# Patient Record
Sex: Male | Born: 1935 | Race: White | Hispanic: No | Marital: Married | Smoking: Never smoker
Health system: Southern US, Academic
[De-identification: ages and names within clinical notes are randomized; demographics above are authoritative.]

## PROBLEM LIST (undated history)

## (undated) DIAGNOSIS — K219 Gastro-esophageal reflux disease without esophagitis: Secondary | ICD-10-CM

## (undated) DIAGNOSIS — E559 Vitamin D deficiency, unspecified: Secondary | ICD-10-CM

## (undated) DIAGNOSIS — G629 Polyneuropathy, unspecified: Secondary | ICD-10-CM

## (undated) DIAGNOSIS — E785 Hyperlipidemia, unspecified: Secondary | ICD-10-CM

## (undated) DIAGNOSIS — I7300 Raynaud's syndrome without gangrene: Secondary | ICD-10-CM

## (undated) HISTORY — PX: HX ROTATOR CUFF REPAIR: SHX139

## (undated) HISTORY — PX: PROSTATE SURGERY: SHX751

## (undated) HISTORY — DX: Vitamin D deficiency, unspecified: E55.9

## (undated) HISTORY — PX: LUMBAR SPINE SURGERY: SHX701

## (undated) HISTORY — DX: Hyperlipidemia, unspecified: E78.5

## (undated) HISTORY — PX: HX TONSILLECTOMY: SHX27

## (undated) HISTORY — DX: Raynaud's syndrome without gangrene: I73.00

## (undated) HISTORY — PX: LITHOTRIPSY: SUR834

## (undated) HISTORY — PX: COLONOSCOPY: SHX174

## (undated) HISTORY — PX: HX HERNIA REPAIR: SHX51

---

## 2007-03-19 ENCOUNTER — Other Ambulatory Visit (HOSPITAL_COMMUNITY): Payer: Self-pay

## 2015-03-12 ENCOUNTER — Ambulatory Visit (INDEPENDENT_AMBULATORY_CARE_PROVIDER_SITE_OTHER): Payer: Self-pay | Admitting: Ophthalmology

## 2015-03-12 NOTE — Telephone Encounter (Signed)
Called and left a message that he would refer to neurology here at [facility] and take the first available

## 2015-03-12 NOTE — Telephone Encounter (Signed)
-----   Message from ClarksdaleKimberly Rager sent at 03/12/2015 10:16 AM EST -----  Chyrel Massonierra from Dr. Thurston HoleHeather Skeens office would like to speak with a tech.  Cierra states Dr. Jayme CloudSkeens would like to know who Dr. Rennis HardingEllis would recommend for patient to see in neurology for peripheral neuropathy.  Chyrel MassonCierra can be reached at [phone] or [phone].  Thank you

## 2015-04-15 ENCOUNTER — Ambulatory Visit (INDEPENDENT_AMBULATORY_CARE_PROVIDER_SITE_OTHER): Payer: Self-pay | Admitting: NEUROLOGY

## 2016-08-10 ENCOUNTER — Ambulatory Visit (HOSPITAL_COMMUNITY): Payer: Self-pay | Admitting: OPHTHALMOLOGY-RETINA SPECIALIST

## 2021-09-03 ENCOUNTER — Other Ambulatory Visit: Payer: Self-pay

## 2021-09-03 ENCOUNTER — Emergency Department
Admission: EM | Admit: 2021-09-03 | Discharge: 2021-09-03 | Disposition: A | Discharge status: Home / Self Care | Payer: Medicare Other | Attending: Emergency Medicine | Admitting: Emergency Medicine

## 2021-09-03 ENCOUNTER — Encounter (HOSPITAL_COMMUNITY): Payer: Self-pay

## 2021-09-03 ENCOUNTER — Ambulatory Visit (HOSPITAL_COMMUNITY): Payer: Medicare Other

## 2021-09-03 DIAGNOSIS — Z20822 Contact with and (suspected) exposure to covid-19: Secondary | ICD-10-CM | POA: Insufficient documentation

## 2021-09-03 DIAGNOSIS — I440 Atrioventricular block, first degree: Secondary | ICD-10-CM | POA: Insufficient documentation

## 2021-09-03 DIAGNOSIS — R0789 Other chest pain: Secondary | ICD-10-CM

## 2021-09-03 HISTORY — DX: Polyneuropathy, unspecified: G62.9

## 2021-09-03 HISTORY — DX: Gastro-esophageal reflux disease without esophagitis: K21.9

## 2021-09-03 LAB — CBC WITH DIFF
BASOPHIL #: 0 10*3/uL (ref 0.00–0.30)
BASOPHIL %: 0 % (ref 0–3)
EOSINOPHIL #: 0 10*3/uL (ref 0.00–0.80)
EOSINOPHIL %: 1 % (ref 0–7)
HCT: 40.4 % — ABNORMAL LOW (ref 42.0–51.0)
HGB: 14 g/dL (ref 13.5–18.0)
LYMPHOCYTE #: 0.6 10*3/uL — ABNORMAL LOW (ref 1.10–5.00)
LYMPHOCYTE %: 9 % — ABNORMAL LOW (ref 25–45)
MCH: 30.1 pg (ref 27.0–32.0)
MCHC: 34.7 g/dL (ref 32.0–36.0)
MCV: 86.5 fL (ref 78.0–99.0)
MONOCYTE #: 0.8 10*3/uL (ref 0.00–1.30)
MONOCYTE %: 11 % (ref 0–12)
MPV: 7.8 fL (ref 7.4–10.4)
NEUTROPHIL #: 5.8 10*3/uL (ref 1.80–8.40)
NEUTROPHIL %: 80 % — ABNORMAL HIGH (ref 40–76)
PLATELETS: 144 10*3/uL (ref 140–440)
RBC: 4.67 10*6/uL (ref 4.20–6.00)
RDW: 15.1 % — ABNORMAL HIGH (ref 11.6–14.8)
WBC: 7.2 10*3/uL (ref 4.0–10.5)
WBCS UNCORRECTED: 7.2 10*3/uL

## 2021-09-03 LAB — URINALYSIS, MICROSCOPIC
HYALINE CASTS: 3 /lpf — ABNORMAL HIGH (ref <0)
RBCS: 1 /hpf (ref <4)
SQUAMOUS EPITHELIAL: <1 /hpf (ref <28)
WBCS: <1 /hpf (ref <6)

## 2021-09-03 LAB — COMPREHENSIVE METABOLIC PANEL, NON-FASTING
ALBUMIN/GLOBULIN RATIO: 1.2 (ref 0.8–1.4)
ALBUMIN: 3.7 g/dL (ref 3.5–5.7)
ALKALINE PHOSPHATASE: 94 U/L (ref 34–104)
ALT (SGPT): 37 U/L (ref 7–52)
ANION GAP: 7 mmol/L — ABNORMAL LOW (ref 10–20)
AST (SGOT): 24 U/L (ref 13–39)
BILIRUBIN TOTAL: 0.7 mg/dL (ref 0.3–1.2)
BUN/CREA RATIO: 20 (ref 6–22)
BUN: 20 mg/dL (ref 7–25)
CALCIUM, CORRECTED: 9.3 mg/dL (ref 8.9–10.8)
CALCIUM: 9 mg/dL (ref 8.6–10.3)
CHLORIDE: 100 mmol/L (ref 98–107)
CO2 TOTAL: 28 mmol/L (ref 21–31)
CREATININE: 1.02 mg/dL (ref 0.60–1.30)
ESTIMATED GFR: 72 mL/min/{1.73_m2} (ref >59)
GLOBULIN: 3.1 (ref 2.9–5.4)
GLUCOSE: 152 mg/dL — ABNORMAL HIGH (ref 74–109)
OSMOLALITY, CALCULATED: 276 mOsm/kg (ref 270–290)
POTASSIUM: 3.8 mmol/L (ref 3.5–5.1)
PROTEIN TOTAL: 6.8 g/dL (ref 6.4–8.9)
SODIUM: 135 mmol/L — ABNORMAL LOW (ref 136–145)

## 2021-09-03 LAB — URINALYSIS, MACROSCOPIC
BILIRUBIN: NEGATIVE mg/dL
BLOOD: 0.03 mg/dL
GLUCOSE: NEGATIVE mg/dL
KETONES: NEGATIVE mg/dL
LEUKOCYTES: NEGATIVE WBCs/uL
NITRITE: NEGATIVE
PH: 5.5 (ref 5.0–9.0)
PROTEIN: 20 mg/dL
SPECIFIC GRAVITY: 1.023 (ref 1.002–1.030)
UROBILINOGEN: NORMAL mg/dL

## 2021-09-03 LAB — COVID-19, FLU A/B, RSV RAPID BY PCR
INFLUENZA VIRUS TYPE A: NOT DETECTED
INFLUENZA VIRUS TYPE B: NOT DETECTED
RESPIRATORY SYNCTIAL VIRUS (RSV): NOT DETECTED
SARS-CoV-2: NOT DETECTED

## 2021-09-03 LAB — PT/INR
INR: 1.17 (ref <=5.00)
PROTHROMBIN TIME: 13.5 seconds — ABNORMAL HIGH (ref 9.8–12.7)

## 2021-09-03 LAB — MAGNESIUM: MAGNESIUM: 2 mg/dL (ref 1.9–2.7)

## 2021-09-03 LAB — TROPONIN-I
TROPONIN I: 4 ng/L (ref <20)
TROPONIN I: 4 ng/L (ref <20)
TROPONIN I: 3 ng/L (ref <20)

## 2021-09-03 LAB — PTT (PARTIAL THROMBOPLASTIN TIME): APTT: 38 seconds — ABNORMAL HIGH (ref 26.0–36.0)

## 2021-09-03 LAB — B-TYPE NATRIURETIC PEPTIDE: BNP: 52 pg/mL (ref 5–100)

## 2021-09-03 LAB — LIPASE: LIPASE: 13 U/L (ref 11–82)

## 2021-09-03 MED ORDER — ASPIRIN 81 MG CHEWABLE TABLET
CHEWABLE_TABLET | ORAL | Status: AC
Start: 2021-09-03 — End: 2021-09-03
  Filled 2021-09-03: qty 4

## 2021-09-03 MED ORDER — ASPIRIN 81 MG CHEWABLE TABLET
324.0000 mg | CHEWABLE_TABLET | ORAL | Status: AC
Start: 2021-09-03 — End: 2021-09-03
  Administered 2021-09-03: 324 mg via ORAL

## 2021-09-03 NOTE — ED APP Handoff Note (Signed)
[facility]  Emergency Department  Provider in Triage Note    Name: Dale Jordan  Age: 88 y.o.  Gender: male     Subjective:   Dale Jordan is a 88 y.o. male who presents with complaint of Chest Pain   .  Pt presents for evaluation of chest pain started today. Pt states he has been sick this past week and was seen at [facility] this past week.  Patient states was negative for COVID and flu.  Does state that he has been having body aches, shortness of breath, nausea, vomiting.  He states he has not been eating and drinking well.  Notes that he has worsening chest pain whenever he bends over and feels he can not get his breath.  Patient states he has not had any diaphoresis.  He does note that he can not eat anything due to feeling sick.    Objective:   Filed Vitals:    09/03/21 1541   BP: 124/65   Pulse: 78   Resp: 20   Temp: 37.1 C (98.8 F)   SpO2: 97%      Focused Physical Exam shows general sitting in wheelchair, no distress noted.     Assessment:  A medical screening exam was completed.  This patient is a 88 y.o. male with initial findings showing chest pain    Plan:  Please see initial orders and work-up below.  This is to be continued with full evaluation in the main Emergency Department.     aspirin chewable tablet 324 mg, 324 mg, Oral, Now       Results for orders placed or performed during the hospital encounter of 09/03/21 (from the past 24 hour(s))   CBC/DIFF    Narrative    The following orders were created for panel order CBC/DIFF.  Procedure                               Abnormality         Status                     ---------                               -----------         ------                     CBC WITH PYKD[[id number]]                                                                 Please view results for these tests on the individual orders.   URINALYSIS, MACROSCOPIC AND MICROSCOPIC W/CULTURE REFLEX    Specimen: Urine, Site not specified    Narrative    The  following orders were created for panel order URINALYSIS, MACROSCOPIC AND MICROSCOPIC W/CULTURE REFLEX.  Procedure                               Abnormality         Status                     ---------                               -----------         ------  URINALYSIS, MACROSCOPIC[[id number]]                                                     URINALYSIS, MICROSCOPIC[[id number]]                                                       Please view results for these tests on the individual orders.        Roma Kayser, APRN  09/03/2021, 15:44

## 2021-09-03 NOTE — ED Provider Notes (Signed)
[facility]  ED Primary Provider Note  History of Present Illness   chief complaint    Arrival: The patient arrived by Car         Dale Jordan is a 88 y.o. male who had concerns including Chest Pain .  Patient 88 year old male presents to the emergency complaint of sharp chest pain which occurred earlier today at approximately noon.  Patient states that he bent over and had a very sharp chest pain in the lower anterior chest.  He rated the pain as 10/10.  He also states he had shortness of breath.  It resolved when he stood up.  He states that he has been having generalized pain from his head to his feet which he has been aching in nature to include a headache x1 week.  He states it did improve earlier today.  He denies any fever.  He is having no shortness of breath.  Denies any prior cardiac history.  Blood pressure is 124/65 with a heart rate of 78, respiratory rate is 20 with an oxygen saturating 97% on room air.  He is afebrile at 98.8.  Patient did go to [place] initially they in turn sent him here to the emergency room.  He did have a negative COVID and flu at [place].  Review of patient's medical records shows that he did have a prior cardiac catheterization 2017 in [facility] for lower extremity edema/pain and possible angina.  This only showed mild occlusive disease with nothing greater than 30% at that time.  All nursing notes reviewed        Review of Systems     No other overt Review of Systems are noted to be positive except noted in the HPI.      Historical Data   History Reviewed This Encounter: Medical History  Surgical History  Family History  Social History        Physical Exam   ED Triage Vitals [09/03/21 1541]   BP (Non-Invasive) 124/65   Heart Rate 78   Respiratory Rate 20   Temperature 37.1 C (98.8 F)   SpO2 97 %   Weight 74.8 kg (165 lb)   Height 1.753 m (_0 )         Exam:   Constitutional:  Patient alert orient x3 in no apparent distress.  No  limitations.  Head: Atraumatic normocephalic  Eyes :  Pupils are equal round reactive to light and accommodation extraocular muscles are intact.  Sclera and conjunctiva are unremarkable  Ears:  Tympanic membranes are pearly gray bilaterally; external auditory canals are unremarkable; external ears without any lesions  Nose:  Nares are patent turbinates are pink and moist  Mouth:  Mucosa is pink and moist without lesions.  Posterior pharynx is pink and moist without hypertrophy/exudate.  Neck:  Soft and supple without palpable lymphadenopathy.  Heart:  Regular rate and rhythm with a 1/6 holosystolic ejection murmur   Lungs:  Clear to auscultation bilaterally without any wheezing/rales/rhonchi  Abdomen:  Soft nontender without any rebound or guarding; positive bowel sounds throughout  Genitalia:  Deferred  Skin:  Hemosiderin deposits to bilateral lower extremity.  Normal skin turgor.  Brisk capillary refill distally  Extremities:  Hemosiderin deposits to bilateral lower extremity otherwise unremarkable.  Good strenght bilaterally with full range of motion of upper and lower extremities.  Neuro:  Alert oriented x3.  Cranial nerves II-XII grossly intact as tested.  Excellent sensation distally over all dermatomes.  Psychiatric:  Patient cooperative, affect appropriate, insight and judgment good      Procedures      Patient Data     Labs Ordered/Reviewed   COMPREHENSIVE METABOLIC PANEL, NON-FASTING - Abnormal; Notable for the following components:       Result Value    SODIUM 135 (*)     ANION GAP 7 (*)     GLUCOSE 152 (*)     All other components within normal limits    Narrative:     Estimated Glomerular Filtration Rate (eGFR) is calculated using the CKD-EPI (2021) equation, intended for patients 62 years of age and older. If gender is not documented or "unknown", there will be no eGFR calculation.   CBC WITH DIFF - Abnormal; Notable for the following components:    HCT 40.4 (*)     RDW 15.1 (*)     NEUTROPHIL % 80 (*)      LYMPHOCYTE % 9 (*)     LYMPHOCYTE # 0.60 (*)     All other components within normal limits   URINALYSIS, MICROSCOPIC - Abnormal; Notable for the following components:    MUCOUS Moderate (*)     HYALINE CASTS 3 (*)     All other components within normal limits   PT/INR - Abnormal; Notable for the following components:    PROTHROMBIN TIME 13.5 (*)     All other components within normal limits    Narrative:     INR OF 2.0-3.0  RECOMMENDED FOR: PROPHYLAXIS/TREATMENT OF VENEOUS THROMBOSIS, PULMONARY EMBOLISM, PREVENTION OF SYSTEMIC EMBOLISM FROM ATRIAL FIBRILATION, MYOCARDIAL INFARCTION.    INR OF 2.5-3.5  RECOMMENDED FOR MECHANICAL PROSTHETIC HEART VALVES, RECURRENT SYSTEMIC EMBOLISM, RECURRENT MYOCARDIAL INFARCTION.     PTT (PARTIAL THROMBOPLASTIN TIME) - Abnormal; Notable for the following components:    APTT 38.0 (*)     All other components within normal limits   TROPONIN-I - Normal   TROPONIN-I - Normal   TROPONIN-I - Normal   B-TYPE NATRIURETIC PEPTIDE - Normal    Narrative:                                 Class 1: 101-250 pg/mL                              Class 2: 251-550 pg/mL                              Class 3: 551-900 pg/mL                              Class 4: >901 pg/mL     The [place] Heart Association has developed a four-stage functional classification system for CHF that is based on a subjective interpretation of the severity of a patient's clinical signs and symptoms.    Class 1 - Patients have no limitations on physical activity and have no symptoms with ordinary physical activity.    Class 2 - Patients have a slight limitation of physical activity and have symptoms with ordinary physical activity.    Class 3 - Patients have a marked limitation of physical activity and have symptoms with less than ordinary physical activity, but not at rest.    Class 4 - Patients are unable to perform any physical  activity without discomfort.   LIPASE - Normal   MAGNESIUM - Normal   URINALYSIS, MACROSCOPIC -  Normal   COVID-19, FLU A/B, RSV RAPID BY PCR - Normal    Narrative:     Results are for the simultaneous qualitative identification of SARS-CoV-2 (formerly 2019-nCoV), Influenza A, Influenza B, and RSV RNA. These etiologic agents are generally detectable in nasopharyngeal and nasal swabs during the ACUTE PHASE of infection. Hence, this test is intended to be performed on respiratory specimens collected from individuals with signs and symptoms of upper respiratory tract infection who meet Centers for Disease Control and Prevention (CDC) clinical and/or epidemiological criteria for Coronavirus Disease 2019 (COVID-19) testing. CDC COVID-19 criteria for testing on human specimens is available at [facility] webpage information for Healthcare Professionals: Coronavirus Disease 2019 (COVID-19) ([employer]).     False-negative results may occur if the virus has genomic mutations, insertions, deletions, or rearrangements or if performed very early in the course of illness. Otherwise, negative results indicate virus specific RNA targets are not detected, however negative results do not preclude SARS-CoV-2 infection/COVID-19, Influenza, or Respiratory syncytial virus infection. Results should not be used as the sole basis for patient management decisions. Negative results must be combined with clinical observations, patient history, and epidemiological information. If upper respiratory tract infection is still suspected based on exposure history together with other clinical findings, re-testing should be considered.    Disclaimer:   This assay has been authorized by FDA under an Emergency Use Authorization for use in laboratories certified under the Clinical Laboratory Improvement Amendments of 1988 (CLIA), 42 U.S.C. [phone], to perform high complexity tests. The impacts of vaccines, antiviral therapeutics, antibiotics, chemotherapeutic or immunosuppressant drugs have not been evaluated.      Test methodology:   Cepheid Xpert Xpress SARS-CoV-2/Flu/RSV Assay real-time polymerase chain reaction (RT-PCR) test on the GeneXpert Dx and Xpert Xpress systems.   CBC/DIFF    Narrative:     The following orders were created for panel order CBC/DIFF.  Procedure                               Abnormality         Status                     ---------                               -----------         ------                     CBC WITH DIFF[[id number]]                Abnormal            Final result                 Please view results for these tests on the individual orders.   URINALYSIS, MACROSCOPIC AND MICROSCOPIC W/CULTURE REFLEX    Narrative:     The following orders were created for panel order URINALYSIS, MACROSCOPIC AND MICROSCOPIC W/CULTURE REFLEX.  Procedure                               Abnormality         Status                     ---------                               -----------         ------  URINALYSIS, MACROSCOPIC[[id number]]      Normal              Final result               URINALYSIS, MICROSCOPIC[[id number]]      Abnormal            Final result                 Please view results for these tests on the individual orders.       XR AP MOBILE CHEST   Final Result by Edi, Radresults In (07/08 1621)   NO ACUTE FINDINGS.         Radiologist location ID: Collin Making          Medical Decision Making  Patient had no recurrence of chest pain while in the emergency room.  Patient's troponin was 4, 3, 3.  EKG showed no acute ischemia.  Patient did have cardiac catheterization in 2017 which showed up to 30 % blockage.  I did discuss patient's findings in detail with him.  I did recommend that he be admitted to the hospitalist service; however, patient states she would rather go home and follow up with Dr. Randolm Idol.  Patient be discharged home.  See discharge instructions for detailed    Amount and/or Complexity of Data Reviewed  Labs: ordered.  Decision-making details documented in ED Course.  Radiology: ordered and independent interpretation performed. Decision-making details documented in ED Course.  ECG/medicine tests: ordered and independent interpretation performed. Decision-making details documented in ED Course.      Risk  OTC drugs.      Critical Care  Total time providing critical care: 0 minutes      ED Course as of 09/03/21 2111   Sat Sep 03, 2021   1642 EKG shows sinus rhythm with a first-degree AV block with a PR interval of 214, normal axis, normal R-wave progression, no ectopy, no ST elevation/depression, normal QRS.  No prior EKG on file.   1837 Single portable view of the chest shows no acute infiltrate, no acute disease process.  There is some mild atelectasis in bilateral bases   2106 Serial troponins are 4, 4, 3.         Medications Administered in the ED   aspirin chewable tablet 324 mg (324 mg Oral Given 09/03/21 1550)       Following the history, physical exam, and ED workup, the patient was deemed stable and suitable for discharge. The patient/caregiver was advised to return to the ED for any new or worsening symptoms. Discharge medications, and follow-up instructions were discussed with the patient/caregiver in detail, who verbalizes understanding. The patient/caregiver is in agreement and is comfortable with the plan of care.    Disposition: Discharged         Current Discharge Medication List      CONTINUE these medications - NO CHANGES were made during your visit.      Details   bumetanide 1 mg Tablet  Commonly known as: BUMEX   1 mg, Oral  Refills: 0     ergocalciferol (vitamin D2) 1,250 mcg (50,000 unit) Capsule  Commonly known as: DRISDOL   50,000 Units, Oral  Refills: 0     omeprazole 20 mg Capsule, Delayed Release(E.C.)  Commonly known as: PRILOSEC   20 mg, Oral  Refills: 0  Follow up:   Gaynelle Adu, DO  [address]  [place] [address]  [phone]    In 1 week  Follow-up with Dr. Randolm Idol for recheck this  week                 Clinical Impression   Atypical chest pain (Primary)         Current Discharge Medication List          R.A. Baldwin [place], DO  Department of Emergency Medicine

## 2021-09-03 NOTE — Discharge Instructions (Signed)
Take all regular medications as prescribed  Follow-up with Dr. Mattie Marlin for recheck this coming week  Return to emergency room for any recurrence of chest pain, shortness of breath, or any concerns

## 2021-09-03 NOTE — ED Nurses Note (Signed)
Patient states he had some nausea and headache that started on Tuesday. Nausea has subsided and has minor headache. Had fever 102 on wedneday tested neg for covid/flu at [facility]. Has c/o weakness, pain and SOB with bending over and increased SOB with walking stairs. Denies SOB, pain, and nausea at this time. Hooked up to monitor including cardiac, BP & O2. Spouse at bedside

## 2021-09-03 NOTE — ED Triage Notes (Signed)
CP AND SOB SINCE THIS AM. STATES HAS BEEN SICK SINCE LAST Tuesday. DECREASED APPETITE, FATIGUE. FLU AND COVID NEG ON WED BY [facility].

## 2021-09-04 LAB — ECG 12 LEAD
Atrial Rate: 81 {beats}/min
Calculated P Axis: 42 degrees
Calculated R Axis: 8 degrees
Calculated T Axis: 2 degrees
PR Interval: 214 ms
QRS Duration: 90 ms
QT Interval: 356 ms
QTC Calculation: 413 ms
Ventricular rate: 81 {beats}/min

## 2021-10-13 ENCOUNTER — Other Ambulatory Visit (HOSPITAL_COMMUNITY): Payer: Self-pay | Admitting: FAMILY PRACTICE

## 2021-10-13 ENCOUNTER — Inpatient Hospital Stay
Admission: RE | Admit: 2021-10-13 | Discharge: 2021-10-13 | Disposition: A | Discharge status: Home / Self Care | Payer: Medicare Other | Source: Ambulatory Visit | Attending: FAMILY PRACTICE | Admitting: FAMILY PRACTICE

## 2021-10-13 ENCOUNTER — Other Ambulatory Visit: Payer: Self-pay

## 2021-10-13 DIAGNOSIS — M542 Cervicalgia: Secondary | ICD-10-CM

## 2021-10-17 ENCOUNTER — Other Ambulatory Visit (HOSPITAL_COMMUNITY): Payer: Self-pay | Admitting: FAMILY PRACTICE

## 2021-10-17 DIAGNOSIS — R519 Headache, unspecified: Secondary | ICD-10-CM

## 2021-10-24 ENCOUNTER — Other Ambulatory Visit: Payer: Self-pay

## 2021-10-24 ENCOUNTER — Inpatient Hospital Stay
Admission: RE | Admit: 2021-10-24 | Discharge: 2021-10-24 | Disposition: A | Discharge status: Home / Self Care | Payer: Medicare Other | Source: Ambulatory Visit | Attending: FAMILY PRACTICE | Admitting: FAMILY PRACTICE

## 2021-10-24 DIAGNOSIS — R519 Headache, unspecified: Secondary | ICD-10-CM | POA: Insufficient documentation

## 2022-03-27 ENCOUNTER — Other Ambulatory Visit (HOSPITAL_COMMUNITY): Payer: Self-pay | Admitting: FAMILY PRACTICE

## 2022-03-27 ENCOUNTER — Other Ambulatory Visit: Payer: Self-pay

## 2022-03-27 ENCOUNTER — Inpatient Hospital Stay
Admission: RE | Admit: 2022-03-27 | Discharge: 2022-03-27 | Disposition: A | Discharge status: Home / Self Care | Payer: Medicare Other | Source: Ambulatory Visit | Attending: FAMILY PRACTICE | Admitting: FAMILY PRACTICE

## 2022-03-27 DIAGNOSIS — U071 COVID-19: Secondary | ICD-10-CM

## 2022-12-12 IMAGING — MR MRI CERVICAL SPINE WITHOUT CONTRAST
4 of 5 series · 24 of 48 positions shown · IV contrast (gadolinium)
Comparison: None available.

﻿EXAM:  [id number]   MRI CERVICAL SPINE WITHOUT CONTRAST
INDICATION: Degenerative disc disease.
TECHNIQUE: Multiplanar multisequential MRI of the cervical spine was performed without gadolinium contrast.

[Series 5: T2 · sagittal · 3.0mm · 0.75mm/px · 8 of 13 slices shown (1 of 2)]
[im 1/13]
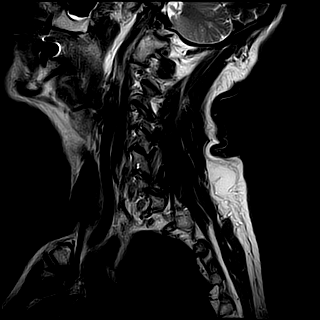
[im 2/13]
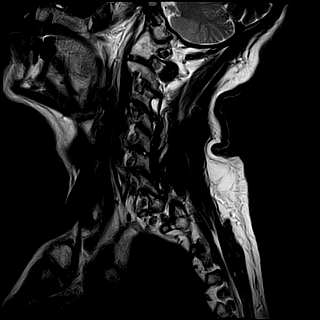
[im 4/13]
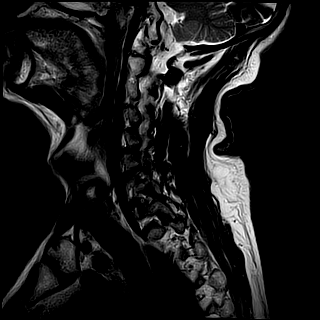
[im 6/13]
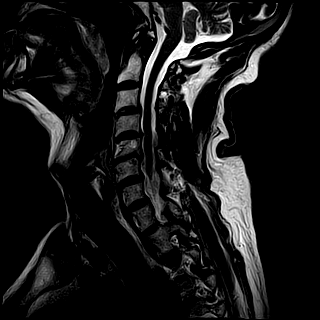
[im 7/13]
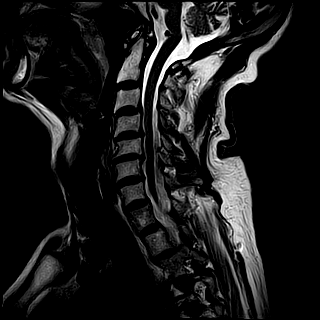
[im 9/13]
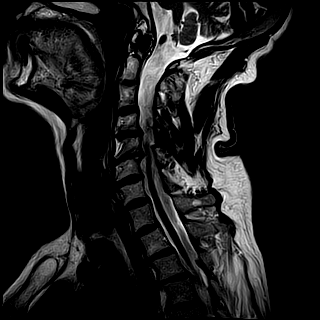
[im 11/13]
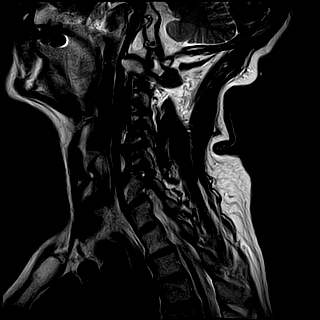
[im 13/13]
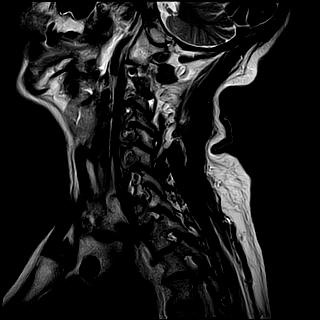

[Series 6: T1 · sagittal · 3.0mm · 0.47mm/px · 4 of 13 slices shown]
[im 1/13]
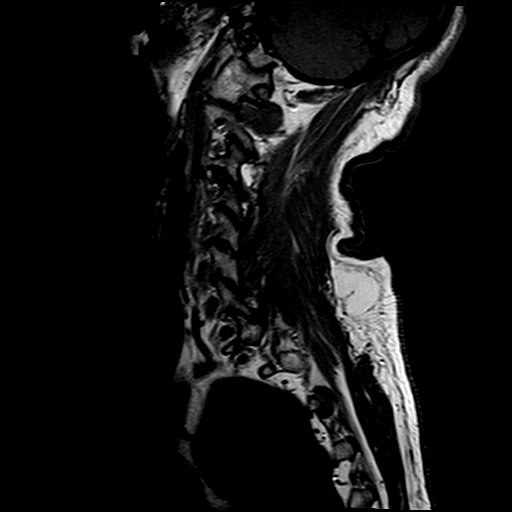
[im 2/13]
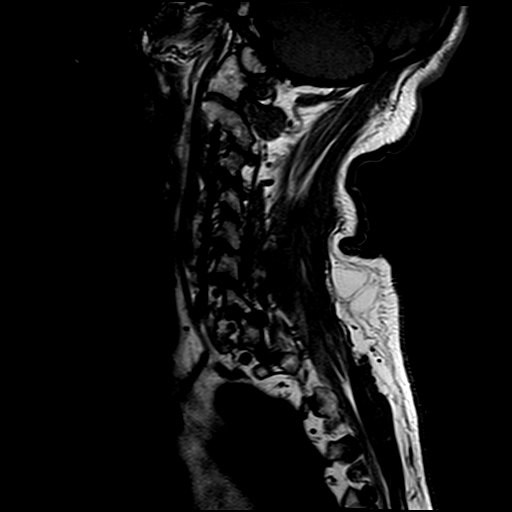
[im 7/13]
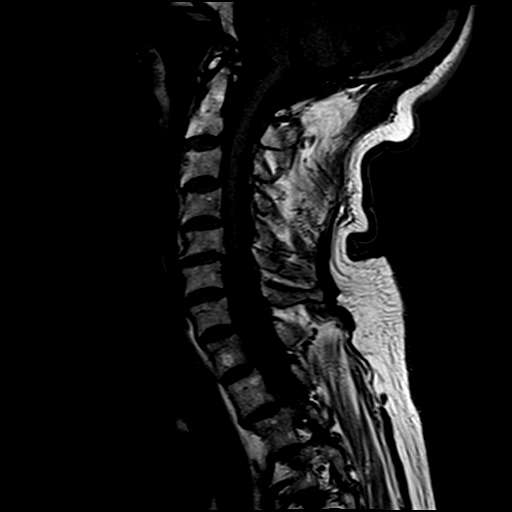
[im 11/13]
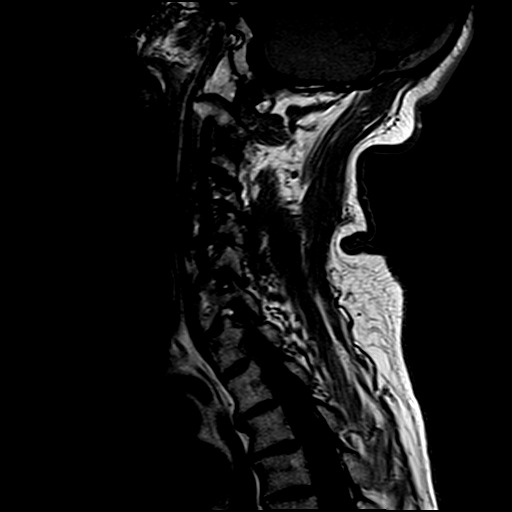

[Series 7: STIR · sagittal · 3.0mm · 0.47mm/px · 3 of 13 slices shown]
[im 2/13]
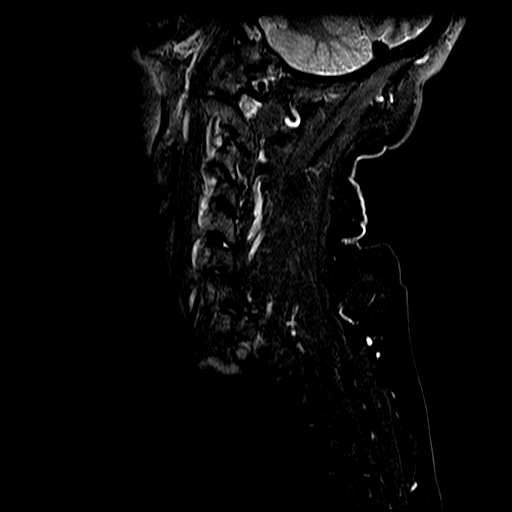
[im 7/13]
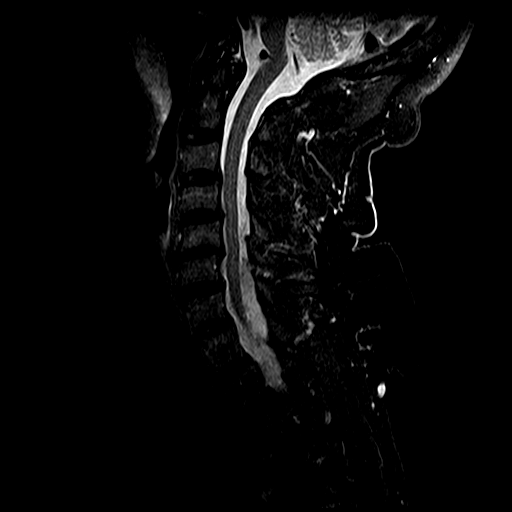
[im 11/13]
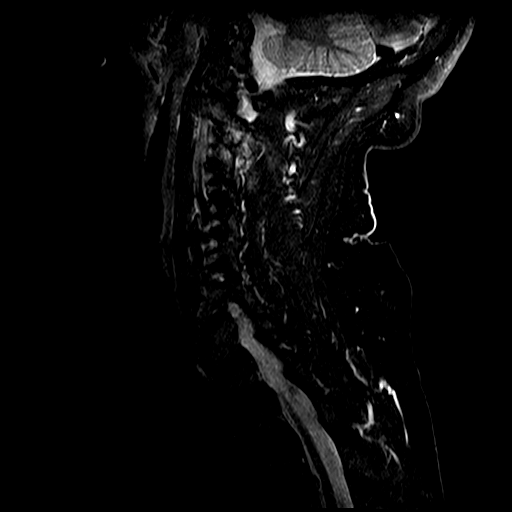

[Series 9: T2 · axial · 3.0mm · 0.39mm/px · z∈[-88,+24]mm · 9 of 18 slices shown (2 of 2)]
[im 1/18]
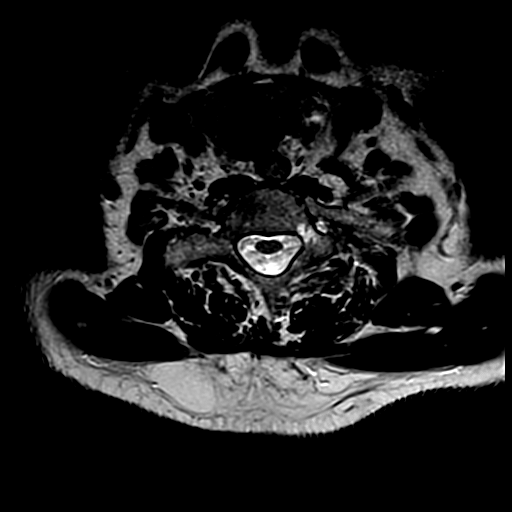
[im 4/18]
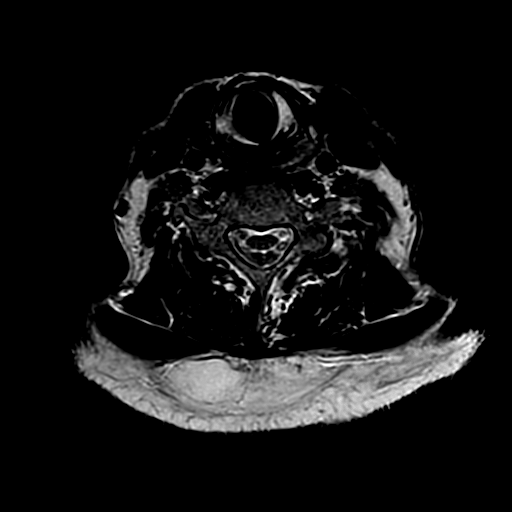
[im 5/18]
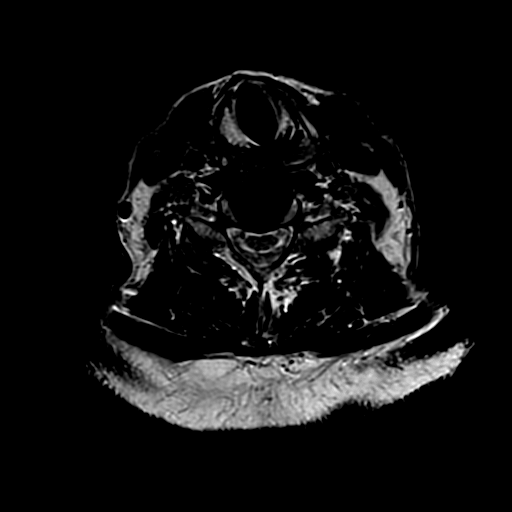
[im 8/18]
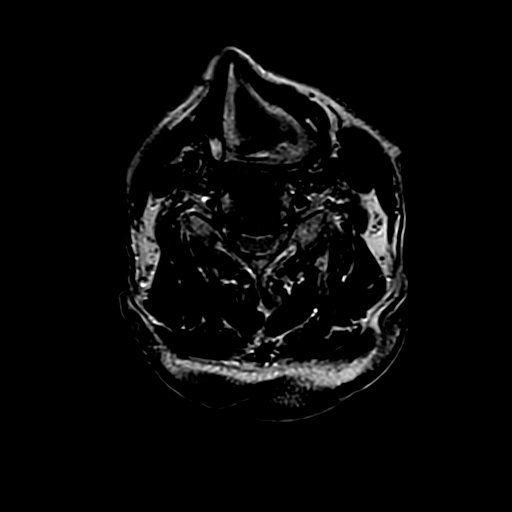
[im 10/18]
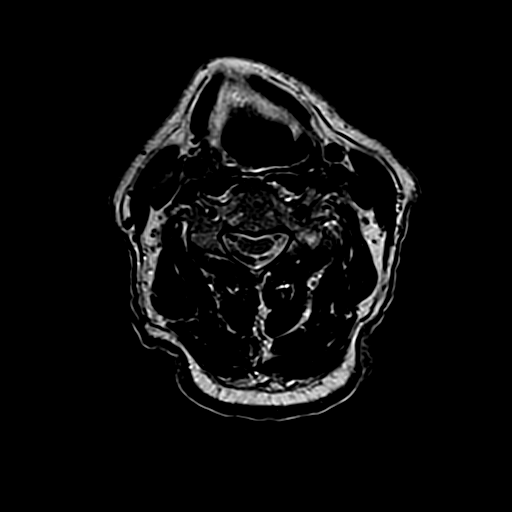
[im 13/18]
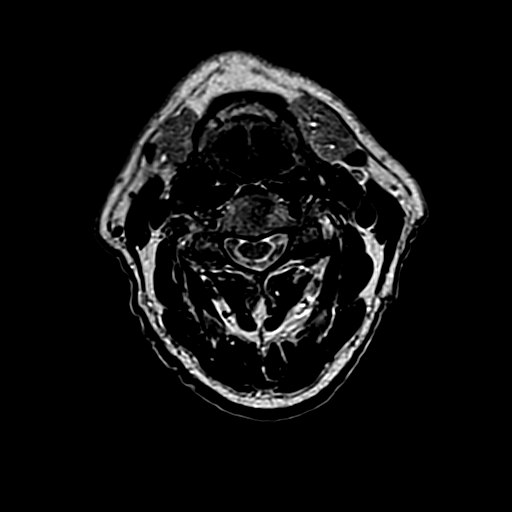
[im 14/18]
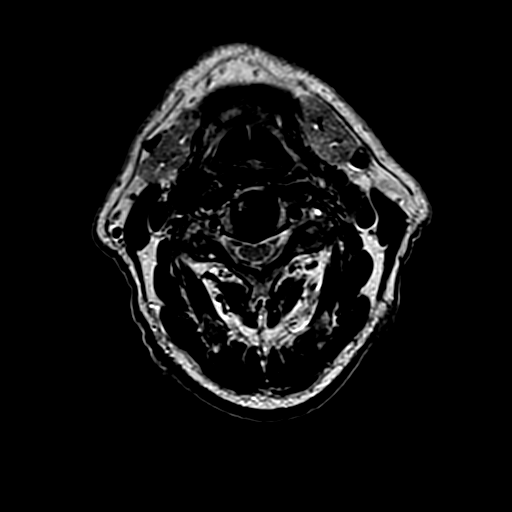
[im 16/18]
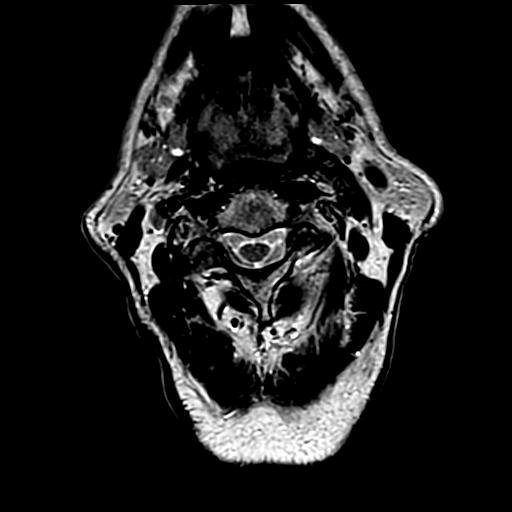
[im 18/18]
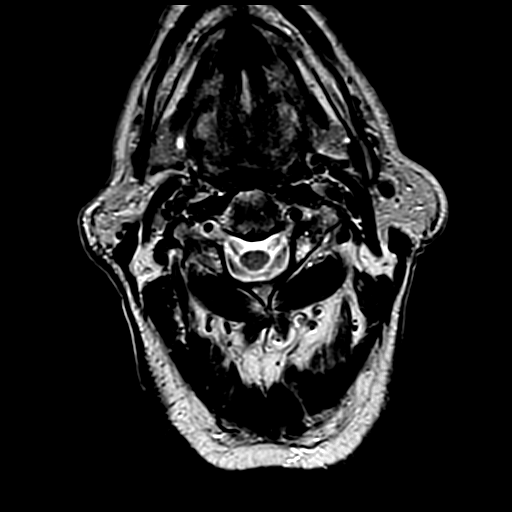

[24 of 48 positions shown; findings below may reference images not displayed]

FINDINGS: There are moderate degenerative changes predominantly at C4-5, C5-6, and C6-7 with disc space narrowing and osteophyte formation.  

There is mild disc bulging at multiple levels including C4-5, C5-6, and C6-7.  There is slight compression of the thecal sac at these levels but no apparent spinal cord compression.  

There is no fracture, malalignment, significant spinal stenosis, disc herniation, abnormality of the spinal cord, or Maria Lucilene malformation.
IMPRESSION: 1. Moderate degenerative changes.

2. Mild multilevel disc bulging. 

3. No spinal stenosis or disc herniation seen.

## 2023-04-09 ENCOUNTER — Encounter (INDEPENDENT_AMBULATORY_CARE_PROVIDER_SITE_OTHER): Payer: Self-pay | Admitting: Surgery

## 2023-04-11 ENCOUNTER — Ambulatory Visit (INDEPENDENT_AMBULATORY_CARE_PROVIDER_SITE_OTHER): Payer: Medicare Other | Admitting: Surgery

## 2023-04-11 ENCOUNTER — Other Ambulatory Visit: Payer: Self-pay

## 2023-04-11 ENCOUNTER — Encounter (INDEPENDENT_AMBULATORY_CARE_PROVIDER_SITE_OTHER): Payer: Self-pay | Admitting: Surgery

## 2023-04-11 VITALS — BP 122/71 | HR 69 | Temp 97.6°F | Wt 178.0 lb

## 2023-04-11 DIAGNOSIS — R1013 Epigastric pain: Secondary | ICD-10-CM

## 2023-04-11 DIAGNOSIS — K219 Gastro-esophageal reflux disease without esophagitis: Secondary | ICD-10-CM

## 2023-04-11 DIAGNOSIS — R110 Nausea: Secondary | ICD-10-CM

## 2023-04-13 ENCOUNTER — Encounter (INDEPENDENT_AMBULATORY_CARE_PROVIDER_SITE_OTHER): Payer: Self-pay | Admitting: Surgery

## 2023-04-13 NOTE — H&P (Signed)
Office History and Physical      Reason for Visit: Esophageal Reflux    History of Present Illness  [name] presents as a referral by Dr Dale Jordan evaluation of symptomatic gastroesophageal reflux disease.  Has a history of controlled symptoms over 25 years with the use of Prilosec.  About 6 weeks ago developed significant worsening symptoms which are only partially controlled with over-the-counter regimen.  Has been on Carafate, Protonix, and Pepcid without relief of symptoms.  Has associated nausea without vomiting.  No melena, hematochezia, hematemesis.  No history of nonsteroidal anti-inflammatory use.  Awakens him at night.  Last colonoscopy was in 2009 revealing internal and external hemorrhoids with diverticulosis.  No prior upper endoscopy.  No weight loss.  Overall very healthy.  Active.        I have reviewed the patient's provided medical records and diagnostic testing including laboratory values, imaging results, documented encounters and providers notes with all pertinent information noted with respect to today's evaluation serving as unique tests and sources as a component of the medical decision making process for this encounter relevant to the patients independent evaluation by me today.        Patient Data  Patient History  Past Medical History:   Diagnosis Date    GERD (gastroesophageal reflux disease)     HLD (hyperlipidemia)     Neuropathy (CMS HCC)     Vitamin D deficiency          Past Surgical History:   Procedure Laterality Date    COLONOSCOPY      HX HERNIA REPAIR      HX ROTATOR CUFF REPAIR      HX TONSILLECTOMY      LITHOTRIPSY      LUMBAR SPINE SURGERY      PROSTATE SURGERY           Current Outpatient Medications   Medication Sig    bumetanide (BUMEX) 1 mg Oral Tablet Take 1 Tablet (1 mg total) by mouth    cilostazoL (PLETAL) 100 mg Oral Tablet Take 1 Tablet (100 mg total) by mouth Twice daily (Patient not taking: Reported on 04/11/2023)    ergocalciferol, vitamin D2, (DRISDOL)  1,250 mcg (50,000 unit) Oral Capsule Take 1 Capsule (50,000 Units total) by mouth    omeprazole (PRILOSEC) 20 mg Oral Capsule, Delayed Release(E.C.) Take 1 Capsule (20 mg total) by mouth    Omeprazole Magnesium 20 mg Oral Tablet, Delayed Release (E.C.) Take 1 Tablet (20 mg total) by mouth    potassium chloride (K-DUR) 10 mEq Oral Tab Sust.Rel. Particle/Crystal Take 1 Tablet (10 mEq total) by mouth     No Known Allergies  Family Medical History:    None         Social History     Tobacco Use    Smoking status: Never    Smokeless tobacco: Never   Vaping Use    Vaping status: Never Used   Substance Use Topics    Alcohol use: Never    Drug use: Never        The above documented section regarding past medical, past surgical, family, and social history (PMFSH) has been reviewed and considered and to the best of my knowledge represents a valid and accurate reflection of the patient's previous pertinent experiences documented by multiple providers and participants of the EMR.I cannot attest to all entries but do no recognize any gross inaccuracies as the data is a common field across all providers  Further history  pertinent to the current encounter will be found as referenced       Physical Examination:    Vitals:    04/11/23 1323   BP: 122/71   Pulse: 69   Temp: 36.4 C (97.6 F)   SpO2: 97%   Weight: 80.7 kg (178 lb)          General:appropriate for age. in no acute distress.  HEENT:Atraumatic, Normocephalic.   Lungs:Nonlabored breathing with symmetric expansion  Heart:Regular wth respect to rate   Abdomen:Soft. Nontender. Nondistended  No peritoneal signs  Skin:No Rashes. No ulcers. Skin warm  Psychiatric:Alert and oriented to person, place, and time. affect appropriate        Diagnosis:    ICD-10-CM    1. Gastroesophageal reflux disease  K21.9       2. Nausea  R11.0       3. Epigastric pain  R10.13           Plan:    Given the failed treatment of symptoms with maximal medical therapy we will go ahead and plan for upper  endoscopy.  Discussed indications, risks, and benefits of upper endoscopy with the patient including but not limited to the  possibility of polypectomy/biopsies,  possible repeat examinations, bleeding, sedation risks including cardiac arrhythmias , possibility of missed diagnosis of polyp or malignancy, and remote possibilities of perforation (which may or may not require operative intervention) and death.  All questions were answered and informed consent was clearly obtained.      This note may have been partially generated using MModal Fluency Direct system, and there may be some incorrect words, spellings, and punctuation that were not noted in checking the note before saving, though effort was made to avoid such errors.    Clide Dales MD FACS RVT  [facility] -General Surgery

## 2023-04-13 NOTE — H&P (View-Only) (Signed)
Office History and Physical      Reason for Visit: Esophageal Reflux    History of Present Illness  Mr. Renault presents as a referral by Dr Mattie Marlin evaluation of symptomatic gastroesophageal reflux disease.  Has a history of controlled symptoms over 25 years with the use of Prilosec.  About 6 weeks ago developed significant worsening symptoms which are only partially controlled with over-the-counter regimen.  Has been on Carafate, Protonix, and Pepcid without relief of symptoms.  Has associated nausea without vomiting.  No melena, hematochezia, hematemesis.  No history of nonsteroidal anti-inflammatory use.  Awakens him at night.  Last colonoscopy was in 2009 revealing internal and external hemorrhoids with diverticulosis.  No prior upper endoscopy.  No weight loss.  Overall very healthy.  Active.        I have reviewed the patient's provided medical records and diagnostic testing including laboratory values, imaging results, documented encounters and providers notes with all pertinent information noted with respect to today's evaluation serving as unique tests and sources as a component of the medical decision making process for this encounter relevant to the patients independent evaluation by me today.        Patient Data  Patient History  Past Medical History:   Diagnosis Date    GERD (gastroesophageal reflux disease)     HLD (hyperlipidemia)     Neuropathy (CMS HCC)     Vitamin D deficiency          Past Surgical History:   Procedure Laterality Date    COLONOSCOPY      HX HERNIA REPAIR      HX ROTATOR CUFF REPAIR      HX TONSILLECTOMY      LITHOTRIPSY      LUMBAR SPINE SURGERY      PROSTATE SURGERY           Current Outpatient Medications   Medication Sig    bumetanide (BUMEX) 1 mg Oral Tablet Take 1 Tablet (1 mg total) by mouth    cilostazoL (PLETAL) 100 mg Oral Tablet Take 1 Tablet (100 mg total) by mouth Twice daily (Patient not taking: Reported on 04/11/2023)    ergocalciferol, vitamin D2, (DRISDOL)  1,250 mcg (50,000 unit) Oral Capsule Take 1 Capsule (50,000 Units total) by mouth    omeprazole (PRILOSEC) 20 mg Oral Capsule, Delayed Release(E.C.) Take 1 Capsule (20 mg total) by mouth    Omeprazole Magnesium 20 mg Oral Tablet, Delayed Release (E.C.) Take 1 Tablet (20 mg total) by mouth    potassium chloride (K-DUR) 10 mEq Oral Tab Sust.Rel. Particle/Crystal Take 1 Tablet (10 mEq total) by mouth     No Known Allergies  Family Medical History:    None         Social History     Tobacco Use    Smoking status: Never    Smokeless tobacco: Never   Vaping Use    Vaping status: Never Used   Substance Use Topics    Alcohol use: Never    Drug use: Never        The above documented section regarding past medical, past surgical, family, and social history (PMFSH) has been reviewed and considered and to the best of my knowledge represents a valid and accurate reflection of the patient's previous pertinent experiences documented by multiple providers and participants of the EMR.I cannot attest to all entries but do no recognize any gross inaccuracies as the data is a common field across all providers  Further history  pertinent to the current encounter will be found as referenced       Physical Examination:    Vitals:    04/11/23 1323   BP: 122/71   Pulse: 69   Temp: 36.4 C (97.6 F)   SpO2: 97%   Weight: 80.7 kg (178 lb)          General:appropriate for age. in no acute distress.  HEENT:Atraumatic, Normocephalic.   Lungs:Nonlabored breathing with symmetric expansion  Heart:Regular wth respect to rate   Abdomen:Soft. Nontender. Nondistended  No peritoneal signs  Skin:No Rashes. No ulcers. Skin warm  Psychiatric:Alert and oriented to person, place, and time. affect appropriate        Diagnosis:    ICD-10-CM    1. Gastroesophageal reflux disease  K21.9       2. Nausea  R11.0       3. Epigastric pain  R10.13           Plan:    Given the failed treatment of symptoms with maximal medical therapy we will go ahead and plan for upper  endoscopy.  Discussed indications, risks, and benefits of upper endoscopy with the patient including but not limited to the  possibility of polypectomy/biopsies,  possible repeat examinations, bleeding, sedation risks including cardiac arrhythmias , possibility of missed diagnosis of polyp or malignancy, and remote possibilities of perforation (which may or may not require operative intervention) and death.  All questions were answered and informed consent was clearly obtained.      This note may have been partially generated using MModal Fluency Direct system, and there may be some incorrect words, spellings, and punctuation that were not noted in checking the note before saving, though effort was made to avoid such errors.    Clide Dales MD FACS RVT  [facility] -General Surgery

## 2023-04-17 ENCOUNTER — Ambulatory Visit (HOSPITAL_COMMUNITY): Payer: Medicare Other | Admitting: Surgery

## 2023-04-17 ENCOUNTER — Encounter (HOSPITAL_COMMUNITY): Payer: Self-pay | Admitting: Surgery

## 2023-04-17 ENCOUNTER — Encounter (HOSPITAL_COMMUNITY)
Admission: RE | Disposition: A | Discharge status: Home / Self Care | Payer: Self-pay | Source: Home / Self Care | Attending: Surgery

## 2023-04-17 ENCOUNTER — Ambulatory Visit
Admission: RE | Admit: 2023-04-17 | Discharge: 2023-04-17 | Disposition: A | Discharge status: Home / Self Care | Payer: Medicare Other | Attending: Surgery | Admitting: Surgery

## 2023-04-17 ENCOUNTER — Other Ambulatory Visit: Payer: Self-pay

## 2023-04-17 ENCOUNTER — Ambulatory Visit (HOSPITAL_COMMUNITY): Payer: Medicare Other

## 2023-04-17 DIAGNOSIS — G629 Polyneuropathy, unspecified: Secondary | ICD-10-CM | POA: Insufficient documentation

## 2023-04-17 DIAGNOSIS — K2950 Unspecified chronic gastritis without bleeding: Secondary | ICD-10-CM | POA: Insufficient documentation

## 2023-04-17 DIAGNOSIS — E785 Hyperlipidemia, unspecified: Secondary | ICD-10-CM | POA: Insufficient documentation

## 2023-04-17 DIAGNOSIS — K449 Diaphragmatic hernia without obstruction or gangrene: Secondary | ICD-10-CM | POA: Insufficient documentation

## 2023-04-17 DIAGNOSIS — R1013 Epigastric pain: Secondary | ICD-10-CM | POA: Insufficient documentation

## 2023-04-17 DIAGNOSIS — K219 Gastro-esophageal reflux disease without esophagitis: Secondary | ICD-10-CM | POA: Insufficient documentation

## 2023-04-17 SURGERY — GASTROSCOPY WITH BIOPSY
Anesthesia: Monitor Anesthesia Care | Wound class: Clean Contaminated Wounds-The respiratory, GI, Genital, or urinary

## 2023-04-17 MED ORDER — FAMOTIDINE 40 MG TABLET
40.0000 mg | ORAL_TABLET | Freq: Two times a day (BID) | ORAL | 1 refills | Status: AC
Start: 2023-04-17 — End: 2023-08-15

## 2023-04-17 MED ORDER — CHOLESTYRAMINE-ASPARTAME 4 GRAM ORAL POWDER FOR SUSP IN A PACKET
4.0000 g | Freq: Every evening | ORAL | 1 refills | Status: DC
Start: 2023-04-17 — End: 2023-04-18

## 2023-04-17 MED ORDER — LACTATED RINGERS INTRAVENOUS SOLUTION
INTRAVENOUS | Status: DC | PRN
Start: 2023-04-17 — End: 2023-04-17

## 2023-04-17 MED ORDER — PROPOFOL 10 MG/ML IV BOLUS
INJECTION | Freq: Once | INTRAVENOUS | Status: DC | PRN
Start: 2023-04-17 — End: 2023-04-17
  Administered 2023-04-17: 30 mg via INTRAVENOUS
  Administered 2023-04-17: 50 mg via INTRAVENOUS

## 2023-04-17 SURGICAL SUPPLY — 3 items
CLEANER INSTRUMENT PRE-KLENZ 13.5 OZ (MISCELLANEOUS PT CARE ITEMS) ×1 IMPLANT
FORCEPS BIOPSY MICROMESH TTH STREAMLINE CATH NEEDLE 240CM 2.4MM RJ 4 SS LRG CPC STRL DISP ORNG 2.8MM (ENDOSCOPIC SUPPLIES) ×1 IMPLANT
USE ITEM 60432 FORCEPS BIOPSY MICROMESH TTH STREAMLINE CATH NEEDLE 240CM 2.4MM RJ 4 SS LRG CPC STRL DISP ORNG 2.8MM (ENDOSCOPIC SUPPLIES) ×1 IMPLANT

## 2023-04-17 NOTE — Nurses Notes (Signed)
Discharge instructions to patient with verbalized understanding.  Ambulated to bathroom with cane and supervision.

## 2023-04-17 NOTE — Interval H&P Note (Signed)
[facility]      H&P UPDATE FORM                                                                                  Dale Jordan, Dale Jordan, 89 y.o. male  Date of Admission:  04/17/2023  Date of Birth:  [birth year]    04/17/2023    STOP: IF H&P IS GREATER THAN 30 DAYS FROM SURGICAL DAY COMPLETE NEW H&P IS REQUIRED.     H & P updated the day of the procedure.  1.  H&P completed within 30 days of surgical procedure and has been reviewed within 24 hours of admission but prior to surgery or a procedure requiring anesthesia services, the patient has been examined, and no change has occured in the patients condition since the H&P was completed.       Change in medications: No              Comments:     2.  Patient continues to be appropriate candidate for planned surgical procedure. YES    Clide Dales, MD

## 2023-04-17 NOTE — OR Surgeon (Signed)
[facility]    OPERATIVE NOTE    Patient Name: Karam, Dunson MRN:: [id number]  Date of Birth: 1934-02-11  Date of Service: 04/17/2023     Pre-Operative Diagnosis: GERD   Epigastric abdominal pain    Post-Operative Diagnosis:   Gastritis   Small hiatal hernia    Procedure(s)/Description:  EGD  WITH BIOPSY: 43239 (CPT)     Attending Surgeon: Clide Dales, MD FACS RVT    Anesthesia Staff:  CRNA: Vivianne Master    Anesthesia Type: .General     Estimated Blood Loss:  minimal    Specimens Removed:   ID Type Source Tests Collected by Time Destination   1 : antrum bx x2 Tissue Antrum SURGICAL PATHOLOGY SPECIMEN Clide Dales, MD 04/17/2023 1140       Order Name Source Comment Collection Info Order Time   SURGICAL PATHOLOGY SPECIMEN Antrum Pre-op diagnosis:  GERD Collected By: Clide Dales, MD 04/17/2023 11:41 AM     Release to patient   Automated             Complications:  None immediate    Indications for procedure:  Rashan, Rounsaville is a 89 y.o. male who presents for  EGD secondary to significant reflux . Risks, benefits, indications, and complications of procedure were discussed in great detail and informed signed consent obtained.         Intraoperative Findings:     The Olympus gastroscope was brought to the operating field gently inserted into the patient's oropharynx and advanced to the level of the second and third portions of the duodenum under direct visualization without difficulty.  Once this anatomic landmark was reached the scope was slowly retracted with circumferential visualization of all upper intestinal walls with findings of gastritis and a small hiatal hernia.  There was no evidence of gastric or duodenal ulcers, polyps, or AV malformations.  Retroflexion of the scope revealed a small hiatal hernia.  The distal esophagus revealed no significant esophagitis with an intact Z line and no evidence of Barrett's changes.  The remainder of the esophagus was within normal  limits without evidence of esophageal stricture or mass.  No esophageal varices.       Description of Procedure     The patient was brought to the Operating Suite and placed in the supine position on the operating table.  Anesthesia/nursing personnel provided IV access as well as hemodynamic monitoring.  After appropriate lines and leads were placed, the patient was placed in the left lateral decubitus position where they received total IV anesthesia.   After the patient was deemed comfortable, the Olympus gastroscope was brought into the operative field, inserted into the patient's oropharynx, and advanced to the level of the esophagus where the esophagus was intubated without difficulty.  The scope was then passed down the esophagus into the stomach.  The stomach was insufflated with air followed by identification of the antrum of the stomach.  The pylorus of the stomach was identified followed by passage of the scope through the pylorus to the second and third portions of the duodenum without difficulty.  Once this anatomic location had been reached, the scope was slowly retracted with circumferential visualization of all upper intestinal walls with findings as dictated above.  The scope was brought back to the antrum of the stomach where antral biopsy for Helicobacter pylori was accomplished followed by retroflexion of the scope to evaluate the fundus, body, and cardia of the stomach with findings as  noted.  The scope was straightened and the stomach decompressed of as much air as possible.  The scope removed back to the GE junction which was inspected thoroughly.  The scope was then slowly retracted through the remainder of the esophagus.         Clide Dales MD FACS RVT  [facility] -General Surgery

## 2023-04-17 NOTE — Interval H&P Note (Signed)
[facility]      H&P UPDATE FORM                                                                                  Kayin, Osment, 89 y.o. male  Date of Admission:  04/17/2023  Date of Birth:  [birth year]    04/17/2023    STOP: IF H&P IS GREATER THAN 30 DAYS FROM SURGICAL DAY COMPLETE NEW H&P IS REQUIRED.     H & P updated the day of the procedure.  1.  H&P completed within 30 days of surgical procedure and has been reviewed within 24 hours of admission but prior to surgery or a procedure requiring anesthesia services, the patient has been examined, and no change has occured in the patients condition since the H&P was completed.       Change in medications: No              Comments:     2.  Patient continues to be appropriate candidate for planned surgical procedure. YES    Clide Dales, MD

## 2023-04-17 NOTE — Anesthesia Preprocedure Evaluation (Signed)
ANESTHESIA PRE-OP EVALUATION  Planned Procedure: EGD  WITH BIOPSY  Review of Systems     anesthesia history negative               Pulmonary  negative pulmonary ROS,    Cardiovascular    Hyperlipidemia ,No peripheral edema,        GI/Hepatic/Renal    GERD        Endo/Other   neg endo/other ROS,       Neuro/Psych/MS       peripheral neuropathy,  Cancer    negative hematology/oncology ROS,                     Physical Assessment      Airway       Mallampati: II    TM distance: >3 FB    Neck ROM: full  Mouth Opening: good.  No Facial hair  No Beard  No endotracheal tube present  No Tracheostomy present    Dental           (+) chipped             Pulmonary    Breath sounds clear to auscultation  (-) no rhonchi, no decreased breath sounds, no wheezes, no rales and no stridor     Cardiovascular    Rhythm: regular  Rate: Normal  (-) no friction rub, carotid bruit is not present, no peripheral edema and no murmur     Other findings              Plan  ASA 3     Planned anesthesia type: MAC                               Anesthetic plan and risks discussed with patient       Use of blood products discussed with patient who.

## 2023-04-17 NOTE — Nurses Notes (Signed)
Sitting up drinking water.  No complaints.

## 2023-04-17 NOTE — Anesthesia Postprocedure Evaluation (Signed)
Anesthesia Post Op Evaluation    Patient: Dale Jordan  Procedure(s):  EGD  WITH BIOPSY    Last Vitals:Temperature: 36.6 C (97.9 F) (04/17/23 1149)  Heart Rate: 78 (04/17/23 1149)  BP (Non-Invasive): 117/67 (04/17/23 1149)  Respiratory Rate: 18 (04/17/23 1149)  SpO2: 100 % (04/17/23 1149)    No notable events documented.    Patient is sufficiently recovered from the effects of anesthesia to participate in the evaluation and has returned to their pre-procedure level.  Patient location during evaluation: PACU       Patient participation: complete - patient participated  Level of consciousness: awake and alert    Pain score: 0  Pain management: adequate    Anesthetic complications: no  Cardiovascular status: acceptable  Respiratory status: acceptable  Hydration status: acceptable

## 2023-04-17 NOTE — Discharge Instructions (Addendum)
SURGICAL DISCHARGE INSTRUCTIONS     Dr. Abbe Amsterdam, Ladona Horns, MD  performed your EGD  WITH BIOPSY today at the [facility]    [place]  [facility]:  Monday through Friday from 8 a.m. - 4 p.m.: [phone]    For T&D: [phone]  Between 4 p.m. - 8 a.m., weekends and holidays:  Call ER [phone]    PLEASE SEE WRITTEN HANDOUTS AS DISCUSSED BY YOUR NURSE:  [facility]      ANESTHESIA INFORMATION   ANESTHESIA -- ADULT PATIENTS:  You have received intravenous sedation / general anesthesia, and you may feel drowsy and light-headed for several hours. You may even experience some forgetfulness of the procedure. DO NOT DRIVE A MOTOR VEHICLE or perform any activity requiring complete alertness or coordination until you feel fully awake in about 24-48 hours. Do not drink alcoholic beverages for at least 24 hours. Do not stay alone, you must have a responsible adult available to be with you. You may also experience a dry mouth or nausea for 24 hours. This is a normal side effect and will disappear as the effects of the medication wear off.    REMEMBER   If you experience any difficulty breathing, chest pain, bleeding that you feel is excessive, persistent nausea or vomiting or for any other concerns:  Call your physician Dr.  Abbe Amsterdam, Ladona Horns, MD   at [phone] . You may also ask to have the general doctor on call paged. They are available to you 24 hours a day.      SPECIAL INSTRUCTIONS / COMMENTS   No driving, operating heavy equipment, cooking or signing of legal documents for 24 hours.  Increase fluid intake today.  Diet as tolerated. Change positions slowly.  Resume normal activities tomorrow as tolerated.  Biopsy was done today.  You will be notified if there are any abnormal results.  Stop Carafate.  Take Pepcid twice a day as directed by Dr Abbe Amsterdam.  Take Questran as directed by Dr Abbe Amsterdam.    FOLLOW-UP APPOINTMENTS   Follow up appointment with Dr Abbe Amsterdam on March 5th at 145 pm.     Dr Whitney Muse [phone]

## 2023-04-18 ENCOUNTER — Other Ambulatory Visit (INDEPENDENT_AMBULATORY_CARE_PROVIDER_SITE_OTHER): Payer: Self-pay | Admitting: Surgery

## 2023-04-18 ENCOUNTER — Telehealth (INDEPENDENT_AMBULATORY_CARE_PROVIDER_SITE_OTHER): Payer: Self-pay | Admitting: Surgery

## 2023-04-18 DIAGNOSIS — K2950 Unspecified chronic gastritis without bleeding: Secondary | ICD-10-CM

## 2023-04-18 LAB — SURGICAL PATHOLOGY SPECIMEN

## 2023-04-18 MED ORDER — CHOLESTYRAMINE-ASPARTAME 4 GRAM ORAL POWDER FOR SUSP IN A PACKET
4.0000 g | Freq: Every evening | ORAL | 0 refills | Status: DC
Start: 2023-04-18 — End: 2023-05-02

## 2023-05-02 ENCOUNTER — Ambulatory Visit (INDEPENDENT_AMBULATORY_CARE_PROVIDER_SITE_OTHER): Payer: Self-pay | Admitting: Surgery

## 2023-05-02 ENCOUNTER — Other Ambulatory Visit: Payer: Self-pay

## 2023-05-02 ENCOUNTER — Encounter (INDEPENDENT_AMBULATORY_CARE_PROVIDER_SITE_OTHER): Payer: Self-pay | Admitting: Surgery

## 2023-05-02 VITALS — BP 119/63 | HR 67 | Temp 97.1°F | Resp 18 | Ht 69.0 in | Wt 174.0 lb

## 2023-05-02 DIAGNOSIS — R110 Nausea: Secondary | ICD-10-CM

## 2023-05-02 DIAGNOSIS — K219 Gastro-esophageal reflux disease without esophagitis: Secondary | ICD-10-CM

## 2023-05-02 DIAGNOSIS — R1013 Epigastric pain: Secondary | ICD-10-CM

## 2023-05-02 MED ORDER — CHOLESTYRAMINE-ASPARTAME 4 GRAM ORAL POWDER FOR SUSP IN A PACKET
4.0000 g | Freq: Every evening | ORAL | 1 refills | Status: DC
Start: 2023-05-02 — End: 2023-10-29

## 2023-05-03 ENCOUNTER — Other Ambulatory Visit (INDEPENDENT_AMBULATORY_CARE_PROVIDER_SITE_OTHER): Payer: Self-pay | Admitting: Surgery

## 2023-05-03 MED ORDER — CHOLESTYRAMINE-ASPARTAME 4 GRAM ORAL POWDER FOR SUSP IN A PACKET
4.0000 g | Freq: Every evening | ORAL | 2 refills | Status: AC
Start: 2023-05-03 — End: 2023-10-30

## 2023-05-05 ENCOUNTER — Encounter (INDEPENDENT_AMBULATORY_CARE_PROVIDER_SITE_OTHER): Payer: Self-pay | Admitting: Surgery

## 2023-05-05 NOTE — Progress Notes (Signed)
 Office Post-op visit      Reason for Visit: Post Op (EGD)    History of Present Illness  [name] presents  for follow- up status post EGD with findings of gastritis and a small hiatal hernia.  Biopsies for H pylori were unremarkable.  Started on Questran 4 symptoms secondary to bile reflux.  This is significantly improved his complaints.  Overall prior symptoms much improved to almost resolved.    I have reviewed the patient's provided medical records and diagnostic testing including laboratory values, imaging results, documented encounters and providers notes with all pertinent information noted with respect to today's evaluation serving as unique tests and sources as a component of the medical decision making process for this encounter relevant to the patients independent evaluation by me today.        Patient Data  Patient History  Past Medical History:   Diagnosis Date    GERD (gastroesophageal reflux disease)     HLD (hyperlipidemia)     Neuropathy (CMS HCC)     Vitamin D deficiency          Past Surgical History:   Procedure Laterality Date    COLONOSCOPY      HX HERNIA REPAIR      HX ROTATOR CUFF REPAIR      HX TONSILLECTOMY      LITHOTRIPSY      LUMBAR SPINE SURGERY      PROSTATE SURGERY           Family Medical History:    None         Social History     Tobacco Use    Smoking status: Never    Smokeless tobacco: Never   Vaping Use    Vaping status: Never Used   Substance Use Topics    Alcohol use: Never    Drug use: Never        The above documented section regarding past medical, past surgical, family, and social history (PMFSH) has been reviewed and considered and to the best of my knowledge represents a valid and accurate reflection of the patient's previous pertinent experiences documented by multiple providers and participants of the EMR.I cannot attest to all entries but do no recognize any gross inaccuracies as the data is a common field across all providers  Further history  pertinent to the current encounter will be found as referenced       Physical Examination:    Vitals:    05/02/23 1308   BP: 119/63   Pulse: 67   Resp: 18   Temp: 36.2 C (97.1 F)   SpO2: 99%   Weight: 78.9 kg (174 lb)   Height: 1.753 m (5\' 9" )   BMI: 25.7        General:appropriate for age. in no acute distress.  HEENT:Atraumatic, Normocephalic.   Lungs:Nonlabored breathing with symmetric expansion  Heart:Regular wth respect to rate   Abdomen:Soft. Nontender. Nondistended  No peritoneal signs  Skin:No Rashes. No ulcers. Skin warm  Psychiatric:Alert and oriented to person, place, and time. affect appropriate            Diagnosis:    ICD-10-CM    1. Gastroesophageal reflux disease  K21.9       2. Epigastric pain  R10.13       3. Nausea  R11.0               Plan:  Feels better.  Refill Questran.  Intraoperative findings discussed in great detail.  No evidence of H pylori.  Refills provided.  Follow up here as needed.  Continue follow up with Dr. Mattie Marlin as scheduled.  Call with questions or concerns.      This note may have been partially generated using MModal Fluency Direct system, and there may be some incorrect words, spellings, and punctuation that were not noted in checking the note before saving, though effort was made to avoid such errors.      Clide Dales MD FACS RVT  [facility] -General Surgery

## 2023-07-03 ENCOUNTER — Telehealth (INDEPENDENT_AMBULATORY_CARE_PROVIDER_SITE_OTHER): Payer: Self-pay | Admitting: Surgery

## 2023-07-03 NOTE — Telephone Encounter (Signed)
 Patient called stating he was told to give an update on questran , it is not helping anymore and his symptoms are worse with reflux. Still taking Pepcid . Please advise. Manya Sells, LPN  03/04/1094 04:54

## 2023-07-04 ENCOUNTER — Other Ambulatory Visit (INDEPENDENT_AMBULATORY_CARE_PROVIDER_SITE_OTHER): Payer: Self-pay | Admitting: Surgery

## 2023-07-04 DIAGNOSIS — R1310 Dysphagia, unspecified: Secondary | ICD-10-CM

## 2023-07-04 NOTE — Telephone Encounter (Signed)
 Left message. Manya Sells, LPN  03/04/1094 04:54

## 2023-07-04 NOTE — Telephone Encounter (Signed)
 Wife aware. Manya Sells, LPN  04/08/5619 30:86

## 2023-07-17 ENCOUNTER — Other Ambulatory Visit: Payer: Self-pay

## 2023-07-17 ENCOUNTER — Ambulatory Visit
Admission: RE | Admit: 2023-07-17 | Discharge: 2023-07-17 | Disposition: A | Discharge status: Home / Self Care | Payer: Self-pay | Source: Ambulatory Visit | Attending: Surgery | Admitting: Surgery

## 2023-07-17 DIAGNOSIS — K219 Gastro-esophageal reflux disease without esophagitis: Secondary | ICD-10-CM

## 2023-07-17 DIAGNOSIS — R1310 Dysphagia, unspecified: Secondary | ICD-10-CM | POA: Insufficient documentation

## 2023-07-17 MED ORDER — BARIUM SULFATE 96 % (W/W) ORAL POWDER FOR SUSPENSION
176.0000 g | INHALATION_SUSPENSION | ORAL | Status: AC
Start: 2023-07-17 — End: 2023-07-17
  Administered 2023-07-17: 176 g via ORAL

## 2023-07-17 MED ORDER — SOD BICARB-CITRIC AC-SIMETH 2.21 GRAM-1.53 GRAM/4 GRAM GRANULES EFFERV
1.0000 | GRANULES | ORAL | Status: AC
Start: 2023-07-17 — End: 2023-07-17
  Administered 2023-07-17: 1 via ORAL

## 2023-07-17 MED ORDER — BARIUM SULFATE 105 % (W/V), 58 % (W/W) ORAL SUSPENSION
300.0000 mL | ORAL | Status: AC
Start: 2023-07-17 — End: 2023-07-17
  Administered 2023-07-17: 300 mL via RECTAL

## 2023-07-17 MED ORDER — BARIUM SULFATE 700 MG TABLET
1.0000 | ORAL_TABLET | ORAL | Status: AC
Start: 2023-07-17 — End: 2023-07-17
  Administered 2023-07-17: 1 via ORAL

## 2023-08-02 ENCOUNTER — Telehealth (INDEPENDENT_AMBULATORY_CARE_PROVIDER_SITE_OTHER): Payer: Self-pay | Admitting: Surgery

## 2023-08-02 NOTE — Telephone Encounter (Signed)
 Patient notified. Voiced understanding.   Justin Olszewski, LPN  03/05/107 32:35

## 2023-08-02 NOTE — Telephone Encounter (Signed)
 Patient called requesting results to barium swallow from 07/17/23. Please advise.   Justin Olszewski, LPN  05/29/3242 01:02

## 2023-08-09 ENCOUNTER — Other Ambulatory Visit: Payer: Self-pay

## 2023-08-09 ENCOUNTER — Ambulatory Visit (INDEPENDENT_AMBULATORY_CARE_PROVIDER_SITE_OTHER): Payer: Self-pay | Admitting: Surgery

## 2023-08-09 ENCOUNTER — Encounter (INDEPENDENT_AMBULATORY_CARE_PROVIDER_SITE_OTHER): Payer: Self-pay | Admitting: Surgery

## 2023-08-09 VITALS — BP 130/68 | HR 113 | Temp 97.3°F | Ht 69.0 in | Wt 174.0 lb

## 2023-08-09 DIAGNOSIS — K219 Gastro-esophageal reflux disease without esophagitis: Secondary | ICD-10-CM

## 2023-08-09 DIAGNOSIS — R1310 Dysphagia, unspecified: Secondary | ICD-10-CM

## 2023-08-09 MED ORDER — OMEPRAZOLE 40 MG CAPSULE,DELAYED RELEASE
40.0000 mg | DELAYED_RELEASE_CAPSULE | Freq: Every day | ORAL | 2 refills | Status: DC
Start: 2023-08-09 — End: 2023-08-13

## 2023-08-13 MED ORDER — OMEPRAZOLE 40 MG CAPSULE,DELAYED RELEASE
40.0000 mg | DELAYED_RELEASE_CAPSULE | Freq: Every day | ORAL | 4 refills | Status: AC
Start: 2023-08-13 — End: ?

## 2023-08-19 ENCOUNTER — Encounter (INDEPENDENT_AMBULATORY_CARE_PROVIDER_SITE_OTHER): Payer: Self-pay | Admitting: Surgery

## 2023-08-19 NOTE — Progress Notes (Signed)
 Office visit      Reason for Visit: Follow-up After Testing (Barium swallow )    History of Present Illness  Dale Jordan presents  for follow up of recent barium swallow with symptoms of reflux and prior EGD.  Previous endoscopy revealed gastritis and a small hiatal hernia.  Recent barium swallow revealed slight delay in passage of a 13 mm barium pill at the GE junction over 90 seconds.  Described initial upper esophageal primary peristalsis followed by tertiary contractibility.  No GE reflux observed.  Upper esophageal reflux observed on recumbency.  Describes worsening symptoms.  His best medication has been Prilosec in the past.      I have reviewed the patient's provided medical records and diagnostic testing including laboratory values, imaging results, documented encounters and providers notes with all pertinent information noted with respect to today's evaluation serving as unique tests and sources as a component of the medical decision making process for this encounter relevant to the patients independent evaluation by me today.        Patient Data  Patient History  Past Medical History:   Diagnosis Date    GERD (gastroesophageal reflux disease)     HLD (hyperlipidemia)     Neuropathy (CMS HCC)     Vitamin D deficiency          Past Surgical History:   Procedure Laterality Date    COLONOSCOPY      HX HERNIA REPAIR      HX ROTATOR CUFF REPAIR      HX TONSILLECTOMY      LITHOTRIPSY      LUMBAR SPINE SURGERY      PROSTATE SURGERY           Family Medical History:    None         Social History[1]     The above documented section regarding past medical, past surgical, family, and social history (PMFSH) has been reviewed and considered and to the best of my knowledge represents a valid and accurate reflection of the patient's previous pertinent experiences documented by multiple providers and participants of the EMR.I cannot attest to all entries but do no recognize any gross inaccuracies as the data  is a common field across all providers  Further history pertinent to the current encounter will be found as referenced       Physical Examination:    Vitals:    08/09/23 1438   BP: 130/68   Pulse: (!) 113   Temp: 36.3 C (97.3 F)   SpO2: 95%   Weight: 78.9 kg (174 lb)   Height: 1.753 m (5' 9)   BMI: 25.7        General:appropriate for age. in no acute distress.  HEENT:Atraumatic, Normocephalic.   Lungs:Nonlabored breathing with symmetric expansion  Heart:Regular wth respect to rate   Abdomen:Soft. Nontender. Nondistended  No peritoneal signs  Skin:No Rashes. No ulcers. Skin warm  Psychiatric:Alert and oriented to person, place, and time. affect appropriate            Diagnosis:  ENCOUNTER DIAGNOSES     ICD-10-CM   1. Dysphagia, unspecified type  R13.10   2. Gastroesophageal reflux disease  K21.9              Plan:  I had a long discussion with the patient and his family regarding his findings.  Results of the upper endoscopy in barium swallow were discussed.  We will go ahead and try Prilosec 40 mg b.i.d.  he will call me with his response.  Consider referral to a tertiary care facility which was declined    This note may have been partially generated using MModal Fluency Direct system, and there may be some incorrect words, spellings, and punctuation that were not noted in checking the note before saving, though effort was made to avoid such errors.      Camellia GORMAN Bud MD FACS RVT  [facility] -General Surgery                  [1]   Social History  Tobacco Use    Smoking status: Never    Smokeless tobacco: Never   Vaping Use    Vaping status: Never Used   Substance Use Topics    Alcohol use: Never    Drug use: Never

## 2023-08-30 ENCOUNTER — Encounter (INDEPENDENT_AMBULATORY_CARE_PROVIDER_SITE_OTHER): Payer: Self-pay | Admitting: Surgery
